# Patient Record
Sex: Male | Born: 1995 | Race: White | Hispanic: No | Marital: Single | Smoking: Current every day smoker
Health system: Southern US, Community
[De-identification: ages and names within clinical notes are randomized; demographics above are authoritative.]

---
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
## 2015-08-07 ENCOUNTER — Encounter (HOSPITAL_COMMUNITY): Payer: Self-pay | Admitting: *Deleted

## 2015-08-07 ENCOUNTER — Emergency Department (HOSPITAL_COMMUNITY)
Admission: EM | Admit: 2015-08-07 | Discharge: 2015-08-07 | Disposition: A | Discharge status: Home / Self Care | Payer: BC Managed Care – PPO | Attending: Emergency Medicine | Admitting: Emergency Medicine

## 2015-08-07 ENCOUNTER — Emergency Department (HOSPITAL_COMMUNITY): Payer: BC Managed Care – PPO

## 2015-08-07 DIAGNOSIS — R55 Syncope and collapse: Secondary | ICD-10-CM | POA: Insufficient documentation

## 2015-08-07 DIAGNOSIS — G629 Polyneuropathy, unspecified: Secondary | ICD-10-CM

## 2015-08-07 DIAGNOSIS — Z720 Tobacco use: Secondary | ICD-10-CM | POA: Diagnosis not present

## 2015-08-07 DIAGNOSIS — R531 Weakness: Secondary | ICD-10-CM | POA: Insufficient documentation

## 2015-08-07 LAB — CBC WITH DIFFERENTIAL/PLATELET
BASOS PCT: 1 % (ref 0–1)
Basophils Absolute: 0 10*3/uL (ref 0.0–0.1)
Eosinophils Absolute: 0.3 10*3/uL (ref 0.0–0.7)
Eosinophils Relative: 6 % — ABNORMAL HIGH (ref 0–5)
HCT: 44.1 % (ref 39.0–52.0)
Hemoglobin: 15.7 g/dL (ref 13.0–17.0)
LYMPHS ABS: 1.7 10*3/uL (ref 0.7–4.0)
Lymphocytes Relative: 28 % (ref 12–46)
MCH: 31.4 pg (ref 26.0–34.0)
MCHC: 35.6 g/dL (ref 30.0–36.0)
MCV: 88.2 fL (ref 78.0–100.0)
Monocytes Absolute: 0.8 10*3/uL (ref 0.1–1.0)
Monocytes Relative: 13 % — ABNORMAL HIGH (ref 3–12)
NEUTROS PCT: 53 % (ref 43–77)
Neutro Abs: 3.1 10*3/uL (ref 1.7–7.7)
Platelets: 185 10*3/uL (ref 150–400)
RBC: 5 MIL/uL (ref 4.22–5.81)
RDW: 12.6 % (ref 11.5–15.5)
WBC: 6 10*3/uL (ref 4.0–10.5)

## 2015-08-07 LAB — COMPREHENSIVE METABOLIC PANEL
ALT: 21 U/L (ref 17–63)
AST: 24 U/L (ref 15–41)
Albumin: 3.9 g/dL (ref 3.5–5.0)
Alkaline Phosphatase: 83 U/L (ref 38–126)
Anion gap: 6 (ref 5–15)
BUN: 11 mg/dL (ref 6–20)
CHLORIDE: 105 mmol/L (ref 101–111)
CO2: 27 mmol/L (ref 22–32)
CREATININE: 0.87 mg/dL (ref 0.61–1.24)
Calcium: 8.6 mg/dL — ABNORMAL LOW (ref 8.9–10.3)
GFR calc Af Amer: >60 mL/min (ref >60)
GFR calc non Af Amer: >60 mL/min (ref >60)
GLUCOSE: 115 mg/dL — AB (ref 65–99)
Potassium: 4 mmol/L (ref 3.5–5.1)
SODIUM: 138 mmol/L (ref 135–145)
Total Bilirubin: 0.5 mg/dL (ref 0.3–1.2)
Total Protein: 6.8 g/dL (ref 6.5–8.1)

## 2015-08-07 MED ORDER — DOXYCYCLINE HYCLATE 100 MG PO CAPS: 100.0000 mg | ORAL_CAPSULE | Freq: Two times a day (BID) | ORAL | Status: AC

## 2015-08-07 MED ORDER — SODIUM CHLORIDE 0.9 % IV BOLUS (SEPSIS)
1000.0000 mL | Freq: Once | INTRAVENOUS | Status: AC
  Administered 2015-08-07: 1000 mL via INTRAVENOUS

## 2015-08-07 NOTE — ED Notes (Signed)
Pt ambulated to bathroom 

## 2015-08-07 NOTE — ED Notes (Signed)
Dr. Pollina at bedside   

## 2015-08-07 NOTE — ED Notes (Signed)
Pt passed out while at [facility] this morning, shortly after having blood drawn. Pt was seen at [facility] last night as well. EMS states tick bite 2 weeks ago and has developed muscle weakness, with weakness to left hand, uanble to make a fist since Saturday.

## 2015-08-07 NOTE — ED Notes (Signed)
Pt taken to MRI  

## 2015-08-07 NOTE — Discharge Instructions (Signed)
Peripheral Neuropathy °Peripheral neuropathy is a type of nerve damage. It affects nerves that carry signals between the spinal cord and other parts of the body. These are called peripheral nerves. With peripheral neuropathy, one nerve or a group of nerves may be damaged.  °CAUSES  °Many things can damage peripheral nerves. For some people with peripheral neuropathy, the cause is unknown. Some causes include: °· Diabetes. This is the most common cause of peripheral neuropathy. °· Injury to a nerve. °· Pressure or stress on a nerve that lasts a long time. °· Too little vitamin B. Alcoholism can lead to this. °· Infections. °· Autoimmune diseases, such as multiple sclerosis and systemic lupus erythematosus. °· Inherited nerve diseases. °· Some medicines, such as cancer drugs. °· Toxic substances, such as lead and mercury. °· Too little blood flowing to the legs. °· Kidney disease. °· Thyroid disease. °SIGNS AND SYMPTOMS  °Different people have different symptoms. The symptoms you have will depend on which of your nerves is damaged.  Common symptoms include: °· Loss of feeling (numbness) in the feet and hands. °· Tingling in the feet and hands. °· Pain that burns. °· Very sensitive skin. °· Weakness. °· Not being able to move a part of the body (paralysis). °· Muscle twitching. °· Clumsiness or poor coordination. °· Loss of balance. °· Not being able to control your bladder. °· Feeling dizzy. °· Sexual problems. °DIAGNOSIS  °Peripheral neuropathy is a symptom, not a disease. Finding the cause of peripheral neuropathy can be hard. To figure that out, your health care provider will take a medical history and do a physical exam. A neurological exam will also be done. This involves checking things affected by your brain, spinal cord, and nerves (nervous system). For example, your health care provider will check your reflexes, how you move, and what you can feel.  °Other types of tests may also be ordered, such as: °· Blood  tests. °· A test of the fluid in your spinal cord. °· Imaging tests, such as CT scans or an MRI. °· Electromyography (EMG). This test checks the nerves that control muscles. °· Nerve conduction velocity tests. These tests check how fast messages pass through your nerves. °· Nerve biopsy. A small piece of nerve is removed. It is then checked under a microscope. °TREATMENT  °· Medicine is often used to treat peripheral neuropathy. Medicines may include: °· Pain-relieving medicines. Prescription or over-the-counter medicine may be suggested. °· Antiseizure medicine. This may be used for pain. °· Antidepressants. These also may help ease pain from neuropathy. °· Lidocaine. This is a numbing medicine. You might wear a patch or be given a shot. °· Mexiletine. This medicine is typically used to help control irregular heart rhythms. °· Surgery. Surgery may be needed to relieve pressure on a nerve or to destroy a nerve that is causing pain. °· Physical therapy to help movement. °· Assistive devices to help movement. °HOME CARE INSTRUCTIONS  °· Only take over-the-counter or prescription medicines as directed by your health care provider. Follow the instructions carefully for any given medicines. Do not take any other medicines without first getting approval from your health care provider. °· If you have diabetes, work closely with your health care provider to keep your blood sugar under control. °· If you have numbness in your feet: °· Check every day for signs of injury or infection. Watch for redness, warmth, and swelling. °· Wear padded socks and comfortable shoes. These help protect your feet. °· Do not do   things that put pressure on your damaged nerve.  Do not smoke. Smoking keeps blood from getting to damaged nerves.  Avoid or limit alcohol. Too much alcohol can cause a lack of B vitamins. These vitamins are needed for healthy nerves.  Develop a good support system. Coping with peripheral neuropathy can be  stressful. Talk to a mental health specialist or join a support group if you are struggling.  Follow up with your health care provider as directed. SEEK MEDICAL CARE IF:   You have new signs or symptoms of peripheral neuropathy.  You are struggling emotionally from dealing with peripheral neuropathy.  You have a fever. SEEK IMMEDIATE MEDICAL CARE IF:   You have an injury or infection that is not healing.  You feel very dizzy or begin vomiting.  You have chest pain.  You have trouble breathing. Document Released: 11/06/2002 Document Revised: 07/29/2011 Document Reviewed: 07/24/2013 [facility] Patient Information 2015 [place], [place]. This information is not intended to replace advice given to you by your health care provider. Make sure you discuss any questions you have with your health care provider. Tick Bite Information Ticks are insects that attach themselves to the skin and draw blood for food. There are various types of ticks. Common types include wood ticks and deer ticks. Most ticks live in shrubs and grassy areas. Ticks can climb onto your body when you make contact with leaves or grass where the tick is waiting. The most common places on the body for ticks to attach themselves are the scalp, neck, armpits, waist, and groin. Most tick bites are harmless, but sometimes ticks carry germs that cause diseases. These germs can be spread to a person during the tick's feeding process. The chance of a disease spreading through a tick bite depends on:   The type of tick.  Time of year.   How long the tick is attached.   Geographic location.  HOW CAN YOU PREVENT TICK BITES? Take these steps to help prevent tick bites when you are outdoors:  Wear protective clothing. Long sleeves and long pants are best.   Wear white clothes so you can see ticks more easily.  Tuck your pant legs into your socks.   If walking on a trail, stay in the middle of the trail to avoid brushing  against bushes.  Avoid walking through areas with long grass.  Put insect repellent on all exposed skin and along boot tops, pant legs, and sleeve cuffs.   Check clothing, hair, and skin repeatedly and before going inside.   Brush off any ticks that are not attached.  Take a shower or bath as soon as possible after being outdoors.  WHAT IS THE PROPER WAY TO REMOVE A TICK? Ticks should be removed as soon as possible to help prevent diseases caused by tick bites.  If latex gloves are available, put them on before trying to remove a tick.   Using fine-point tweezers, grasp the tick as close to the skin as possible. You may also use curved forceps or a tick removal tool. Grasp the tick as close to its head as possible. Avoid grasping the tick on its body.  Pull gently with steady upward pressure until the tick lets go. Do not twist the tick or jerk it suddenly. This may break off the tick's head or mouth parts.  Do not squeeze or crush the tick's body. This could force disease-carrying fluids from the tick into your body.   After the tick is removed,  wash the bite area and your hands with soap and water or other disinfectant such as alcohol.  Apply a small amount of antiseptic cream or ointment to the bite site.   Wash and disinfect any instruments that were used.  Do not try to remove a tick by applying a hot match, petroleum jelly, or fingernail polish to the tick. These methods do not work and may increase the chances of disease being spread from the tick bite.  WHEN SHOULD YOU SEEK MEDICAL CARE? Contact your health care provider if you are unable to remove a tick from your skin or if a part of the tick breaks off and is stuck in the skin.  After a tick bite, you need to be aware of signs and symptoms that could be related to diseases spread by ticks. Contact your health care provider if you develop any of the following in the days or weeks after the tick bite:  Unexplained  fever.  Rash. A circular rash that appears days or weeks after the tick bite may indicate the possibility of Lyme disease. The rash may resemble a target with a bull's-eye and may occur at a different part of your body than the tick bite.  Redness and swelling in the area of the tick bite.   Tender, swollen lymph glands.   Diarrhea.   Weight loss.   Cough.   Fatigue.   Muscle, joint, or bone pain.   Abdominal pain.   Headache.   Lethargy or a change in your level of consciousness.  Difficulty walking or moving your legs.   Numbness in the legs.   Paralysis.  Shortness of breath.   Confusion.   Repeated vomiting.  Document Released: 11/13/2000 Document Revised: 09/06/2013 Document Reviewed: 04/26/2013 [facility] Patient Information 2015 [place], [place]. This information is not intended to replace advice given to you by your health care provider. Make sure you discuss any questions you have with your health care provider.  Syncope Syncope is a medical term for fainting or passing out. This means you lose consciousness and drop to the ground. People are generally unconscious for less than 5 minutes. You may have some muscle twitches for up to 15 seconds before waking up and returning to normal. Syncope occurs more often in older adults, but it can happen to anyone. While most causes of syncope are not dangerous, syncope can be a sign of a serious medical problem. It is important to seek medical care.  CAUSES  Syncope is caused by a sudden drop in blood flow to the brain. The specific cause is often not determined. Factors that can bring on syncope include:  Taking medicines that lower blood pressure.  Sudden changes in posture, such as standing up quickly.  Taking more medicine than prescribed.  Standing in one place for too long.  Seizure disorders.  Dehydration and excessive exposure to heat.  Low blood sugar (hypoglycemia).  Straining to have a  bowel movement.  Heart disease, irregular heartbeat, or other circulatory problems.  Fear, emotional distress, seeing blood, or severe pain. SYMPTOMS  Right before fainting, you may:  Feel dizzy or light-headed.  Feel nauseous.  See all white or all black in your field of vision.  Have cold, clammy skin. DIAGNOSIS  Your health care provider will ask about your symptoms, perform a physical exam, and perform an electrocardiogram (ECG) to record the electrical activity of your heart. Your health care provider may also perform other heart or blood tests to determine  the cause of your syncope which may include:  Transthoracic echocardiogram (TTE). During echocardiography, sound waves are used to evaluate how blood flows through your heart.  Transesophageal echocardiogram (TEE).  Cardiac monitoring. This allows your health care provider to monitor your heart rate and rhythm in real time.  Holter monitor. This is a portable device that records your heartbeat and can help diagnose heart arrhythmias. It allows your health care provider to track your heart activity for several days, if needed.  Stress tests by exercise or by giving medicine that makes the heart beat faster. TREATMENT  In most cases, no treatment is needed. Depending on the cause of your syncope, your health care provider may recommend changing or stopping some of your medicines. HOME CARE INSTRUCTIONS  Have someone stay with you until you feel stable.  Do not drive, use machinery, or play sports until your health care provider says it is okay.  Keep all follow-up appointments as directed by your health care provider.  Lie down right away if you start feeling like you might faint. Breathe deeply and steadily. Wait until all the symptoms have passed.  Drink enough fluids to keep your urine clear or pale yellow.  If you are taking blood pressure or heart medicine, get up slowly and take several minutes to sit and then  stand. This can reduce dizziness. SEEK IMMEDIATE MEDICAL CARE IF:   You have a severe headache.  You have unusual pain in the chest, abdomen, or back.  You are bleeding from your mouth or rectum, or you have black or tarry stool.  You have an irregular or very fast heartbeat.  You have pain with breathing.  You have repeated fainting or seizure-like jerking during an episode.  You faint when sitting or lying down.  You have confusion.  You have trouble walking.  You have severe weakness.  You have vision problems. If you fainted, call your local emergency services (911 in [place] Do not drive yourself to the hospital.  MAKE SURE YOU:  Understand these instructions.  Will watch your condition.  Will get help right away if you are not doing well or get worse. Document Released: 11/16/2005 Document Revised: 11/21/2013 Document Reviewed: 01/15/2012 [facility] Patient Information 2015 [place], [place]. This information is not intended to replace advice given to you by your health care provider. Make sure you discuss any questions you have with your health care provider.

## 2015-08-07 NOTE — ED Notes (Signed)
Pt taken to CT.

## 2015-08-07 NOTE — ED Notes (Signed)
Pt returned from MRI °

## 2015-08-07 NOTE — ED Provider Notes (Signed)
CSN: [id number]     Arrival date & time 08/07/15  0903 History  This chart was scribed for Gilda Crease, MD by Marica Otter, ED Scribe. This patient was seen in room APA04/APA04 and the patient's care was started at 9:07 AM.   Chief Complaint  Patient presents with  . Loss of Consciousness   HPI PCP: Trinna Post, MD HPI Comments: Terry Johnston is a 19 y.o. male brought in by ambulance, who presents to the Emergency Department complaining of one episode of loss of consciousness this morning shortly after pt had his blood drawn at [facility]. Pt also complains of a tick bite to his left arm 2 weeks ago and associated weakness to his left hand-- pt specifies he has been unable to make a fist with his left hand for the past 4 days due to increased weakness.  History reviewed. No pertinent past medical history. History reviewed. No pertinent past surgical history. No family history on file. Social History  Substance Use Topics  . Smoking status: Current Every Day Smoker    Types: Cigarettes  . Smokeless tobacco: None  . Alcohol Use: Yes     Comment: Occ    Review of Systems  Constitutional: Negative for fever and chills.  Neurological: Positive for syncope and weakness.  All other systems reviewed and are negative.  Allergies  Clarithromycin  Home Medications   Prior to Admission medications   Medication Sig Start Date End Date Taking? Authorizing Provider  ibuprofen (ADVIL,MOTRIN) 200 MG tablet Take 800 mg by mouth daily as needed for moderate pain.   Yes Historical Provider, MD  doxycycline (VIBRAMYCIN) 100 MG capsule Take 1 capsule (100 mg total) by mouth 2 (two) times daily. 08/07/15   Gilda Crease, MD   Triage Vitals: BP 137/91 mmHg  Pulse 70  Temp(Src) 97.9 F (36.6 C) (Oral)  Resp 14  Ht 5\' 11"  (1.803 m)  Wt 185 lb (83.915 kg)  BMI 25.81 kg/m2  SpO2 100% Physical Exam  Constitutional: He is oriented to person, place, and time. He  appears well-developed and well-nourished. No distress.  HENT:  Head: Normocephalic and atraumatic.  Right Ear: Hearing normal.  Left Ear: Hearing normal.  Nose: Nose normal.  Mouth/Throat: Oropharynx is clear and moist and mucous membranes are normal.  Eyes: Conjunctivae and EOM are normal. Pupils are equal, round, and reactive to light.  Neck: Normal range of motion. Neck supple.  Cardiovascular: Regular rhythm, S1 normal and S2 normal.  Exam reveals no gallop and no friction rub.   No murmur heard. Pulmonary/Chest: Effort normal and breath sounds normal. No respiratory distress. He exhibits no tenderness.  Abdominal: Soft. Normal appearance and bowel sounds are normal. There is no hepatosplenomegaly. There is no tenderness. There is no rebound, no guarding, no tenderness at McBurney's point and negative Murphy's sign. No hernia.  Musculoskeletal: Normal range of motion.  Neurological: He is alert and oriented to person, place, and time. He has normal strength. No cranial nerve deficit or sensory deficit. Coordination normal. GCS eye subscore is 4. GCS verbal subscore is 5. GCS motor subscore is 6.  Decreased grip strength left greater than right   Cranial nerves 3-12 grossly intact. DTRs normal and symmetric.    Skin: Skin is warm, dry and intact. No rash noted. No cyanosis.  Psychiatric: He has a normal mood and affect. His speech is normal and behavior is normal. Thought content normal.  Nursing note and vitals reviewed.  ED Course  Procedures (including critical care time) DIAGNOSTIC STUDIES: Oxygen Saturation is 100% on RA, normal by my interpretation.    COORDINATION OF CARE: 9:11 AM-Discussed treatment plan with pt at bedside and pt agreed to plan.  11:26 AM: Recheck, pt resting comfortably. Discussed MRI with pt with parents bedside.   Labs Review Labs Reviewed  CBC WITH DIFFERENTIAL/PLATELET - Abnormal; Notable for the following:    Monocytes Relative 13 (*)     Eosinophils Relative 6 (*)    All other components within normal limits  COMPREHENSIVE METABOLIC PANEL - Abnormal; Notable for the following:    Glucose, Bld 115 (*)    Calcium 8.6 (*)    All other components within normal limits    Imaging Review Ct Head Wo Contrast  08/07/2015   CLINICAL DATA:  Syncope  EXAM: CT HEAD WITHOUT CONTRAST  TECHNIQUE: Contiguous axial images were obtained from the base of the skull through the vertex without intravenous contrast.  COMPARISON:  None.  FINDINGS: No skull fracture is noted. Paranasal sinuses and mastoid air cells are unremarkable. No intracranial hemorrhage, mass effect or midline shift. No acute cortical infarction. No mass lesion is noted on this unenhanced scan. The gray and white-matter differentiation is preserved. No intra or extra-axial fluid collection. No hydrocephalus.  IMPRESSION: No acute intracranial abnormality.   Electronically Signed   By: Natasha Mead M.D.   On: 08/07/2015 10:22   Mr Brain Wo Contrast  08/07/2015   CLINICAL DATA:  Left greater than right hand numbness and weakness. Symptoms present for several days. Syncopal episode while having blood drawn today.  EXAM: MRI HEAD WITHOUT CONTRAST  TECHNIQUE: Multiplanar, multiecho pulse sequences of the brain and surrounding structures were obtained without intravenous contrast.  COMPARISON:  Head CT earlier today  FINDINGS: There is no acute infarct. Ventricles and sulci are normal for age. There is no evidence of intracranial hemorrhage, mass, midline shift, or extra-axial fluid collection. No brain parenchymal signal abnormality is identified.  Orbits are unremarkable. Paranasal sinuses and mastoid air cells are clear. Major intracranial vascular flow voids are preserved. Calvarium and scalp soft tissues are unremarkable.  IMPRESSION: Unremarkable brain MRI.   Electronically Signed   By: Sebastian Ache M.D.   On: 08/07/2015 13:31   Mr Cervical Spine Wo Contrast  08/07/2015   CLINICAL DATA:   Left greater than right hand numbness and weakness. Tightness and pain in both forearms. Symptoms began after syncopal episode at physician's office.  EXAM: MRI CERVICAL SPINE WITHOUT CONTRAST  TECHNIQUE: Multiplanar, multisequence MR imaging of the cervical spine was performed. No intravenous contrast was administered.  COMPARISON:  None.  FINDINGS: Normal signal is present in the cervical and upper thoracic spinal cord to the lowest imaged level, T2-3. Marrow signal, vertebral body heights, and alignment are normal. Limited imaging of the neck is within normal limits for age. Scattered lymph nodes and prominent adenoid tissue is within normal limits for age.  No significant focal disc protrusion or stenosis is present. The foramina are patent bilaterally.  IMPRESSION: Negative MRI of the cervical spine.   Electronically Signed   By: Marin Roberts M.D.   On: 08/07/2015 13:35   I have personally reviewed and evaluated these images and lab results as part of my medical decision-making.   EKG Interpretation   Date/Time:  Wednesday August 07 2015 09:05:13 EDT Ventricular Rate:  64 PR Interval:  133 QRS Duration: 101 QT Interval:  405 QTC Calculation: 418 R Axis:  70 Text Interpretation:  Sinus rhythm Early repolarization Normal ECG  Confirmed by Garlan Drewes  MD, Ireene Ballowe [phone]) on 08/07/2015 9:16:36 AM      MDM   Final diagnoses:  Peripheral neuropathy  Syncope, vasovagal   Patient was sent to the ER for evaluation of syncope. Patient passed out at his doctor's office after giving blood. This appears to have been secondary to vasovagal syncope. His workup has been unrevealing.  Patient was seeing his doctor today because of weakness in his hands. Patient reports that he has been experiencing swelling of his forearms, pain in his forearms and hands and decreased ability to grip and close his hands. He was bitten by a tick 2 weeks ago. His doctor drew Lyme's disease labs today.  Examination reveals no significant swelling of his forearms. Compartments are soft. Radial pulses are palpable. Patient does have decreased ability to close his fist and grip strength. Etiology of this is unclear. I did do a screening CT scan after his syncope and it was normal. He then went back to radiology for MRI of brain and MR of the cervical spine to evaluate for central nervous system causes. No acute abnormality was noted.  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 I suspect that this is an overuse/inflammatory problem. I have recommended that he take 3 days off, stay hydrated. He will, however, referred to neurology to further evaluate for the possibility of peripheral nerve problems. In addition, he will be prescribed doxycycline empirically because of the recent tick bite.   I personally performed the services described in this documentation, which was scribed in my presence. The recorded information has been reviewed and is accurate.      Gilda Crease, MD 08/07/15 [phone]

## 2018-07-05 ENCOUNTER — Emergency Department
Admission: EM | Admit: 2018-07-05 | Discharge: 2018-07-05 | Disposition: A | Discharge status: Home / Self Care | Payer: BC Managed Care – PPO | Attending: Emergency Medicine | Admitting: Emergency Medicine

## 2018-07-05 ENCOUNTER — Other Ambulatory Visit: Payer: Self-pay

## 2018-07-05 ENCOUNTER — Emergency Department: Payer: BC Managed Care – PPO

## 2018-07-05 ENCOUNTER — Encounter: Payer: Self-pay | Admitting: Emergency Medicine

## 2018-07-05 DIAGNOSIS — Z79899 Other long term (current) drug therapy: Secondary | ICD-10-CM | POA: Diagnosis not present

## 2018-07-05 DIAGNOSIS — F17210 Nicotine dependence, cigarettes, uncomplicated: Secondary | ICD-10-CM | POA: Insufficient documentation

## 2018-07-05 DIAGNOSIS — Y92410 Unspecified street and highway as the place of occurrence of the external cause: Secondary | ICD-10-CM | POA: Insufficient documentation

## 2018-07-05 DIAGNOSIS — R51 Headache: Secondary | ICD-10-CM | POA: Diagnosis not present

## 2018-07-05 DIAGNOSIS — S161XXA Strain of muscle, fascia and tendon at neck level, initial encounter: Secondary | ICD-10-CM

## 2018-07-05 DIAGNOSIS — M7918 Myalgia, other site: Secondary | ICD-10-CM

## 2018-07-05 DIAGNOSIS — S199XXA Unspecified injury of neck, initial encounter: Secondary | ICD-10-CM | POA: Diagnosis present

## 2018-07-05 DIAGNOSIS — Y9389 Activity, other specified: Secondary | ICD-10-CM | POA: Insufficient documentation

## 2018-07-05 DIAGNOSIS — Y998 Other external cause status: Secondary | ICD-10-CM | POA: Insufficient documentation

## 2018-07-05 MED ORDER — IBUPROFEN 800 MG PO TABS: 800.0000 mg | ORAL_TABLET | Freq: Three times a day (TID) | ORAL | 0 refills | Status: AC | PRN

## 2018-07-05 MED ORDER — TRAMADOL HCL 50 MG PO TABS: 50.0000 mg | ORAL_TABLET | Freq: Two times a day (BID) | ORAL | 0 refills | Status: AC | PRN

## 2018-07-05 MED ORDER — CYCLOBENZAPRINE HCL 10 MG PO TABS: 10.0000 mg | ORAL_TABLET | Freq: Three times a day (TID) | ORAL | 0 refills | Status: AC | PRN
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
## 2018-07-05 NOTE — Discharge Instructions (Signed)
Follow discharge care instruction and did not take pain and muscle relaxers while working.
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
## 2018-12-02 ENCOUNTER — Ambulatory Visit: Payer: BC Managed Care – PPO | Admitting: Urology

## 2018-12-02 DIAGNOSIS — L723 Sebaceous cyst: Secondary | ICD-10-CM | POA: Diagnosis not present

## 2019-12-13 ENCOUNTER — Other Ambulatory Visit: Payer: Self-pay

## 2019-12-13 ENCOUNTER — Ambulatory Visit: Payer: Self-pay | Attending: Internal Medicine

## 2019-12-13 DIAGNOSIS — Z20822 Contact with and (suspected) exposure to covid-19: Secondary | ICD-10-CM

## 2019-12-14 LAB — NOVEL CORONAVIRUS, NAA: SARS-CoV-2, NAA: NOT DETECTED
